# Patient Record
Sex: Female | Born: 2000 | Hispanic: No | Marital: Single | Smoking: Never smoker
Health system: Southern US, Community
[De-identification: ages and names within clinical notes are randomized; demographics above are authoritative.]

## PROBLEM LIST (undated history)

## (undated) DIAGNOSIS — G43909 Migraine, unspecified, not intractable, without status migrainosus: Secondary | ICD-10-CM

## (undated) DIAGNOSIS — I471 Supraventricular tachycardia: Secondary | ICD-10-CM

## (undated) DIAGNOSIS — J020 Streptococcal pharyngitis: Secondary | ICD-10-CM

## (undated) HISTORY — PX: WISDOM TOOTH EXTRACTION: SHX21

---

## 2019-11-16 ENCOUNTER — Ambulatory Visit: Admission: EM | Admit: 2019-11-16 | Disposition: A | Discharge status: Home / Self Care | Payer: Self-pay

## 2019-11-16 ENCOUNTER — Ambulatory Visit
Admission: EM | Admit: 2019-11-16 | Discharge: 2019-11-16 | Disposition: A | Discharge status: Home / Self Care | Payer: Managed Care, Other (non HMO)

## 2019-11-16 ENCOUNTER — Other Ambulatory Visit: Payer: Self-pay

## 2019-11-16 DIAGNOSIS — J0110 Acute frontal sinusitis, unspecified: Secondary | ICD-10-CM | POA: Diagnosis not present

## 2019-11-16 MED ORDER — FLUCONAZOLE 200 MG PO TABS: 200.0000 mg | ORAL_TABLET | Freq: Once | ORAL | 0 refills | Status: AC

## 2019-11-16 MED ORDER — AMOXICILLIN-POT CLAVULANATE 875-125 MG PO TABS: 1.0000 | ORAL_TABLET | Freq: Two times a day (BID) | ORAL | 0 refills | Status: AC

## 2019-11-16 NOTE — ED Provider Notes (Signed)
[facility] CARE CENTER   [id number] 11/16/19 Arrival Time: 1742  ZW:CHEN THROAT  SUBJECTIVE: History from: patient.  Brenda Dennis is a 19 y.o. female who presents with abrupt onset of nasal congestion, headache, fatigue for the last 10 days.  Reports that she has been using DayQuil with no relief.  Reports that she then tried Mucinex sinus max with temporary relief.  Reports history of sinus infections.  Denies sick exposure to Covid, strep, flu or mono, or precipitating event. Has negative history of Covid. Has not had Covid vaccines.  There are no aggravating factors. Denies previous symptoms in the past.     Denies fever, chills, rhinorrhea, cough, SOB, wheezing, chest pain, nausea, rash, changes in bowel or bladder habits.    ROS: As per HPI.  All other pertinent ROS negative.     History reviewed. No pertinent past medical history. History reviewed. No pertinent surgical history. No Known Allergies No current facility-administered medications on file prior to encounter.   Current Outpatient Medications on File Prior to Encounter  Medication Sig Dispense Refill  . Norethin Ace-Eth Estrad-FE (TAYTULLA) 1-20 MG-MCG(24) CAPS     . norethindrone-ethinyl estradiol-iron (JUNEL FE 1.5/30) 1.5-30 MG-MCG tablet Take 1 tablet by mouth daily.    Marland Kitchen topiramate (TOPAMAX) 50 MG tablet     . albuterol (VENTOLIN HFA) 108 (90 Base) MCG/ACT inhaler SMARTSIG:1-2 Puff(s) Via Inhaler Every 8 Hours PRN    . escitalopram (LEXAPRO) 10 MG tablet Take 15 mg by mouth daily.    Marland Kitchen gabapentin (NEURONTIN) 100 MG capsule Take 100 mg by mouth at bedtime.    . saccharomyces boulardii (FLORASTOR) 250 MG capsule Take by mouth.     Social History   Socioeconomic History  . Marital status: Single    Spouse name: Not on file  . Number of children: Not on file  . Years of education: Not on file  . Highest education level: Not on file  Occupational History  . Not on file  Tobacco Use  . Smoking status: Never  Smoker  . Smokeless tobacco: Never Used  Substance and Sexual Activity  . Alcohol use: Yes  . Drug use: Not on file  . Sexual activity: Not Currently    Birth control/protection: Pill  Other Topics Concern  . Not on file  Social History Narrative  . Not on file   Social Determinants of Health   Financial Resource Strain:   . Difficulty of Paying Living Expenses: Not on file  Food Insecurity:   . Worried About Programme researcher, broadcasting/film/video in the Last Year: Not on file  . Ran Out of Food in the Last Year: Not on file  Transportation Needs:   . Lack of Transportation (Medical): Not on file  . Lack of Transportation (Non-Medical): Not on file  Physical Activity:   . Days of Exercise per Week: Not on file  . Minutes of Exercise per Session: Not on file  Stress:   . Feeling of Stress : Not on file  Social Connections:   . Frequency of Communication with Friends and Family: Not on file  . Frequency of Social Gatherings with Friends and Family: Not on file  . Attends Religious Services: Not on file  . Active Member of Clubs or Organizations: Not on file  . Attends Banker Meetings: Not on file  . Marital Status: Not on file  Intimate Partner Violence:   . Fear of Current or Ex-Partner: Not on file  . Emotionally Abused: Not  on file  . Physically Abused: Not on file  . Sexually Abused: Not on file   Family History  Problem Relation Age of Onset  . Healthy Mother   . Healthy Father     OBJECTIVE:  Vitals:   11/16/19 1755  BP: 113/80  Pulse: 76  Resp: 16  Temp: 99.1 F (37.3 C)  TempSrc: Oral  SpO2: 98%     General appearance: alert; appears fatigued, but nontoxic, speaking in full sentences and managing own secretions HEENT: NCAT; Ears: EACs clear, TMs pearly gray with visible cone of light, without erythema; Eyes: PERRL, EOMI grossly; Nose: no obvious rhinorrhea; Throat: oropharynx clear, tonsils 1+ and mildly erythematous without white tonsillar exudates, uvula  midline; Sinuses: Left-sided sinuses tender to palpation Neck: supple with LAD Lungs: CTA bilaterally without adventitious breath sounds; cough absent Heart: regular rate and rhythm.  Radial pulses 2+ symmetrical bilaterally Skin: warm and dry Psychological: alert and cooperative; normal mood and affect  LABS: No results found for this or any previous visit (from the past 24 hour(s)).   ASSESSMENT & PLAN:  1. Acute non-recurrent frontal sinusitis     Meds ordered this encounter  Medications  . amoxicillin-clavulanate (AUGMENTIN) 875-125 MG tablet    Sig: Take 1 tablet by mouth 2 (two) times daily for 10 days.    Dispense:  20 tablet    Refill:  0    Order Specific Question:   Supervising Provider    Answer:   Merrilee Jansky [id number]  . fluconazole (DIFLUCAN) 200 MG tablet    Sig: Take 1 tablet (200 mg total) by mouth once for 1 dose.    Dispense:  2 tablet    Refill:  0    Order Specific Question:   Supervising Provider    Answer:   Merrilee Jansky [id number]    Acute Sinusitis Push fluids and get rest Prescribed amoxicillin 875mg  twice daily for 10 days.  Prescribed fluconazole in case of yeast Take as directed and to completion.  Drink warm or cool liquids, use throat lozenges, or popsicles to help alleviate symptoms Take OTC ibuprofen or tylenol as needed for pain May use Zyrtec D and flonase to help alleviate symptoms Follow up with PCP if symptoms persist Return or go to ER if you have any new or worsening symptoms such as fever, chills, nausea, vomiting, worsening sore throat, cough, abdominal pain, chest pain, changes in bowel or bladder habits.   Reviewed expectations re: course of current medical issues. Questions answered. Outlined signs and symptoms indicating need for more acute intervention. Patient verbalized understanding. After Visit Summary given.          , NP 11/16/19 1821

## 2019-11-16 NOTE — Discharge Instructions (Addendum)
You have a sinus infection  I have sent in Augmentin for you to take twice a day for 10 days  I have also sent in fluconazole in case of yeast.  Take 1 tablet on the day you start experiencing symptoms.  If you are still having symptoms 3 days later, take the second tablet.  Follow-up with this office or with primary care as needed  Follow-up with the ER for trouble swallowing, trouble breathing, high fever, other concerning symptoms.

## 2019-11-16 NOTE — ED Triage Notes (Addendum)
Pt is here with sinues that started 10 days ago, pt has taken Flonase, DayQuil, and Mucniex to relieve discomfort.

## 2019-12-09 ENCOUNTER — Ambulatory Visit
Admission: EM | Admit: 2019-12-09 | Discharge: 2019-12-09 | Disposition: A | Discharge status: Home / Self Care | Payer: Managed Care, Other (non HMO) | Attending: Family Medicine | Admitting: Family Medicine

## 2019-12-09 DIAGNOSIS — J029 Acute pharyngitis, unspecified: Secondary | ICD-10-CM | POA: Diagnosis not present

## 2019-12-09 DIAGNOSIS — B2790 Infectious mononucleosis, unspecified without complication: Secondary | ICD-10-CM | POA: Diagnosis not present

## 2019-12-09 DIAGNOSIS — R5383 Other fatigue: Secondary | ICD-10-CM

## 2019-12-09 DIAGNOSIS — B349 Viral infection, unspecified: Secondary | ICD-10-CM | POA: Diagnosis not present

## 2019-12-09 HISTORY — DX: Supraventricular tachycardia: I47.1

## 2019-12-09 HISTORY — DX: Migraine, unspecified, not intractable, without status migrainosus: G43.909

## 2019-12-09 HISTORY — DX: Streptococcal pharyngitis: J02.0

## 2019-12-09 LAB — POCT MONO SCREEN (KUC): Mono, POC: POSITIVE — AB

## 2019-12-09 LAB — POCT RAPID STREP A (OFFICE): Rapid Strep A Screen: NEGATIVE

## 2019-12-09 NOTE — Discharge Instructions (Signed)
You have mono. This is a virus.  I have included information about this attached to your paperwork  Follow up as needed with this office or with primary care as needed  Follow up with the ER for trouble swallowing, trouble breathing, other concerning symptoms.

## 2019-12-09 NOTE — ED Triage Notes (Signed)
Pt was seen here for a sinus infection a few weeks ago.  Has a little post nasal drip but feel the sinus pressure and nasal congestion are mostly resolved.  Feeling of strep throat started yesterday.  Feels like razor blades when swallowing.

## 2019-12-09 NOTE — ED Provider Notes (Signed)
[facility] CARE CENTER   [id number] 12/09/19 Arrival Time: 1731  PQ:ZRAQ THROAT  SUBJECTIVE: History from: patient.  Brenda Dennis is a 19 y.o. female who presents with abrupt onset of sore throat for the last 2 to 3 days. Reports that she was seen here in this office 2 weeks ago for sinus infection. Reports that she completed her antibiotics and that that illness is resolved. She feels that this is not related. Reports that her roommates are sick as well. Denies sick exposure to Covid, strep, flu or mono, or precipitating event. Symptoms are made worse with swallowing, but tolerating liquids and own secretions without difficulty.  Denies previous symptoms in the past.     Denies fever, chills, ear pain, sinus pain, rhinorrhea, nasal congestion, cough, SOB, wheezing, chest pain, nausea, rash, changes in bowel or bladder habits.     ROS: As per HPI.  All other pertinent ROS negative.     Past Medical History:  Diagnosis Date  . Migraines   . Strep throat   . SVT (supraventricular tachycardia) (HCC)    Past Surgical History:  Procedure Laterality Date  . WISDOM TOOTH EXTRACTION     No Known Allergies No current facility-administered medications on file prior to encounter.   Current Outpatient Medications on File Prior to Encounter  Medication Sig Dispense Refill  . gabapentin (NEURONTIN) 100 MG capsule Take 100 mg by mouth at bedtime.    . norethindrone-ethinyl estradiol-iron (JUNEL FE 1.5/30) 1.5-30 MG-MCG tablet Take 1 tablet by mouth daily.    Marland Kitchen albuterol (VENTOLIN HFA) 108 (90 Base) MCG/ACT inhaler SMARTSIG:1-2 Puff(s) Via Inhaler Every 8 Hours PRN    . escitalopram (LEXAPRO) 10 MG tablet Take 15 mg by mouth daily.    . Norethin Ace-Eth Estrad-FE (TAYTULLA) 1-20 MG-MCG(24) CAPS     . saccharomyces boulardii (FLORASTOR) 250 MG capsule Take by mouth.    . topiramate (TOPAMAX) 50 MG tablet      Social History   Socioeconomic History  . Marital status: Single    Spouse  name: Not on file  . Number of children: Not on file  . Years of education: Not on file  . Highest education level: Not on file  Occupational History  . Not on file  Tobacco Use  . Smoking status: Never Smoker  . Smokeless tobacco: Never Used  Vaping Use  . Vaping Use: Never used  Substance and Sexual Activity  . Alcohol use: Never  . Drug use: Never  . Sexual activity: Not Currently    Birth control/protection: Pill  Other Topics Concern  . Not on file  Social History Narrative  . Not on file   Social Determinants of Health   Financial Resource Strain:   . Difficulty of Paying Living Expenses: Not on file  Food Insecurity:   . Worried About Programme researcher, broadcasting/film/video in the Last Year: Not on file  . Ran Out of Food in the Last Year: Not on file  Transportation Needs:   . Lack of Transportation (Medical): Not on file  . Lack of Transportation (Non-Medical): Not on file  Physical Activity:   . Days of Exercise per Week: Not on file  . Minutes of Exercise per Session: Not on file  Stress:   . Feeling of Stress : Not on file  Social Connections:   . Frequency of Communication with Friends and Family: Not on file  . Frequency of Social Gatherings with Friends and Family: Not on file  . Attends Religious  Services: Not on file  . Active Member of Clubs or Organizations: Not on file  . Attends Banker Meetings: Not on file  . Marital Status: Not on file  Intimate Partner Violence:   . Fear of Current or Ex-Partner: Not on file  . Emotionally Abused: Not on file  . Physically Abused: Not on file  . Sexually Abused: Not on file   Family History  Problem Relation Age of Onset  . Healthy Mother   . Healthy Father     OBJECTIVE:  Vitals:   12/09/19 1753  BP: 129/87  Pulse: 94  Resp: 18  Temp: 99 F (37.2 C)  TempSrc: Oral  SpO2: 98%     General appearance: alert; appears fatigued, but nontoxic, speaking in full sentences and managing own  secretions HEENT: NCAT; Ears: EACs clear, TMs pearly gray with visible cone of light, without erythema; Eyes: PERRL, EOMI grossly; Nose: no obvious rhinorrhea; Throat: oropharynx erythematous, tonsils 1+ and erythematous with white tonsillar exudates, uvula midline Neck: supple with LAD Lungs: CTA bilaterally without adventitious breath sounds; cough absent Heart: regular rate and rhythm.  Radial pulses 2+ symmetrical bilaterally Skin: warm and dry Psychological: alert and cooperative; normal mood and affect  LABS: Results for orders placed or performed during the hospital encounter of 12/09/19 (from the past 24 hour(s))  POCT rapid strep A     Status: None   Collection Time: 12/09/19  5:48 PM  Result Value Ref Range   Rapid Strep A Screen Negative Negative  POCT mono screen     Status: Abnormal   Collection Time: 12/09/19  6:00 PM  Result Value Ref Range   Mono, POC Positive (A) Negative     ASSESSMENT & PLAN:  1. Infectious mononucleosis without complication, infectious mononucleosis due to unspecified organism   2. Sore throat   3. Other fatigue   4. Viral illness    Strep test negative, will send out for culture and we will call you with results Mono test is positive Get plenty of rest and push fluids Take OTC Zyrtec and use chloraseptic spray as needed for throat pain. Drink warm or cool liquids, use throat lozenges, or popsicles to help alleviate symptoms Take OTC ibuprofen or tylenol as needed for pain Follow up with PCP if symptoms persists Return or go to ER if patient has any new or worsening symptoms such as fever, chills, nausea, vomiting, worsening sore throat, cough, abdominal pain, chest pain, changes in bowel or bladder habits  Reviewed expectations re: course of current medical issues. Questions answered. Outlined signs and symptoms indicating need for more acute intervention. Patient verbalized understanding. After Visit Summary given.           Moshe Cipro, NP 12/10/19 1253

## 2020-01-03 ENCOUNTER — Ambulatory Visit
Admission: EM | Admit: 2020-01-03 | Discharge: 2020-01-03 | Disposition: A | Discharge status: Home / Self Care | Payer: Managed Care, Other (non HMO) | Attending: Emergency Medicine | Admitting: Emergency Medicine

## 2020-01-03 ENCOUNTER — Ambulatory Visit (INDEPENDENT_AMBULATORY_CARE_PROVIDER_SITE_OTHER): Payer: Managed Care, Other (non HMO)

## 2020-01-03 DIAGNOSIS — R0981 Nasal congestion: Secondary | ICD-10-CM

## 2020-01-03 DIAGNOSIS — R059 Cough, unspecified: Secondary | ICD-10-CM

## 2020-01-03 DIAGNOSIS — J069 Acute upper respiratory infection, unspecified: Secondary | ICD-10-CM

## 2020-01-03 MED ORDER — BENZONATATE 100 MG PO CAPS: 100.0000 mg | ORAL_CAPSULE | Freq: Three times a day (TID) | ORAL | 0 refills | Status: AC

## 2020-01-03 NOTE — ED Triage Notes (Addendum)
Pt presents with complaints of continued cough. Reports she was here and diagnosed with Mono a month ago. Reports concern for the chest congestion and cough turning productive over the last week. Reports difficulty sleeping at night. A deep breath in triggers her cough. Pt covid test was negative. Reports taking otc medications with no relief.

## 2020-01-03 NOTE — ED Provider Notes (Signed)
Renaldo Fiddler    CSN: [id number] Arrival date & time: 01/03/20  2751      History   Chief Complaint Chief Complaint  Patient presents with  . Cough    HPI Brenda Dennis is a 19 y.o. female.   Presents with 1 month history of cough productive of yellow phlegm.  She also reports sinus congestion, runny nose, postnasal drip.  She denies fever, chills, shortness of breath, vomiting, diarrhea, or other symptoms.  Patient was seen here by NP Ashley Royalty on 12/09/2019; diagnosed with mono, sore throat, fatigue, viral illness; treated symptomatically.  She was also seen here by NP Ashley Royalty on 11/16/2019; diagnosed with acute sinusitis; treated with Augmentin and Diflucan.  Her medical history includes SVT, migraine headaches, strep throat.  The history is provided by the patient and medical records.    Past Medical History:  Diagnosis Date  . Migraines   . Strep throat   . SVT (supraventricular tachycardia) (HCC)     There are no problems to display for this patient.   Past Surgical History:  Procedure Laterality Date  . WISDOM TOOTH EXTRACTION      OB History   No obstetric history on file.      Home Medications    Prior to Admission medications   Medication Sig Start Date End Date Taking? Authorizing Provider  albuterol (VENTOLIN HFA) 108 (90 Base) MCG/ACT inhaler SMARTSIG:1-2 Puff(s) Via Inhaler Every 8 Hours PRN 07/04/19   [provider]  benzonatate (TESSALON) 100 MG capsule Take 1 capsule (100 mg total) by mouth every 8 (eight) hours. 01/03/20   Mickie Bail, NP  escitalopram (LEXAPRO) 10 MG tablet Take 15 mg by mouth daily. 09/03/19   [provider]  gabapentin (NEURONTIN) 100 MG capsule Take 100 mg by mouth at bedtime. 10/31/19   [provider]  Norethin Ace-Eth Estrad-FE (TAYTULLA) 1-20 MG-MCG(24) CAPS  08/02/16   [provider]  norethindrone-ethinyl estradiol-iron (JUNEL FE 1.5/30) 1.5-30 MG-MCG tablet Take 1 tablet by  mouth daily. 05/08/18   [provider]  saccharomyces boulardii (FLORASTOR) 250 MG capsule Take by mouth.    [provider]  topiramate (TOPAMAX) 50 MG tablet  05/04/14   [provider]    Family History Family History  Problem Relation Age of Onset  . Healthy Mother   . Healthy Father     Social History Social History   Tobacco Use  . Smoking status: Never Smoker  . Smokeless tobacco: Never Used  Vaping Use  . Vaping Use: Never used  Substance Use Topics  . Alcohol use: Never  . Drug use: Never     Allergies   Patient has no known allergies.   Review of Systems Review of Systems  Constitutional: Negative for chills and fever.  HENT: Positive for congestion, postnasal drip and rhinorrhea. Negative for ear pain and sore throat.   Eyes: Negative for pain and visual disturbance.  Respiratory: Positive for cough. Negative for shortness of breath.   Cardiovascular: Negative for chest pain and palpitations.  Gastrointestinal: Negative for abdominal pain, diarrhea and vomiting.  Genitourinary: Negative for dysuria and hematuria.  Musculoskeletal: Negative for arthralgias and back pain.  Skin: Negative for color change and rash.  Neurological: Negative for seizures and syncope.  All other systems reviewed and are negative.    Physical Exam Triage Vital Signs ED Triage Vitals  Enc Vitals Group     BP      Pulse  Resp      Temp      Temp src      SpO2      Weight      Height      Head Circumference      Peak Flow      Pain Score      Pain Loc      Pain Edu?      Excl. in GC?    No data found.  Updated Vital Signs BP 113/79   Pulse 76   Temp 98.8 F (37.1 C)   Resp 19   LMP 12/27/2019   SpO2 97%   Visual Acuity Right Eye Distance:   Left Eye Distance:   Bilateral Distance:    Right Eye Near:   Left Eye Near:    Bilateral Near:     Physical Exam Vitals and nursing note reviewed.  Constitutional:      General:  She is not in acute distress.    Appearance: She is well-developed. She is not ill-appearing.  HENT:     Head: Normocephalic and atraumatic.     Right Ear: Tympanic membrane normal.     Left Ear: Tympanic membrane normal.     Nose: Rhinorrhea present.     Mouth/Throat:     Mouth: Mucous membranes are moist.     Pharynx: Oropharynx is clear.  Eyes:     Conjunctiva/sclera: Conjunctivae normal.  Cardiovascular:     Rate and Rhythm: Normal rate and regular rhythm.     Heart sounds: No murmur heard.   Pulmonary:     Effort: Pulmonary effort is normal. No respiratory distress.     Breath sounds: Normal breath sounds. No wheezing or rhonchi.  Abdominal:     Palpations: Abdomen is soft.     Tenderness: There is no abdominal tenderness. There is no guarding or rebound.  Musculoskeletal:     Cervical back: Neck supple.  Skin:    General: Skin is warm and dry.     Findings: No rash.  Neurological:     General: No focal deficit present.     Mental Status: She is alert and oriented to person, place, and time.     Gait: Gait normal.  Psychiatric:        Mood and Affect: Mood normal.        Behavior: Behavior normal.      UC Treatments / Results  Labs (all labs ordered are listed, but only abnormal results are displayed) Labs Reviewed - No data to display  EKG   Radiology DG Chest 2 View  Result Date: 01/03/2020 CLINICAL DATA:  Productive cough. EXAM: CHEST - 2 VIEW COMPARISON:  None. FINDINGS: The heart size and mediastinal contours are within normal limits. Both lungs are clear. The visualized skeletal structures are unremarkable. IMPRESSION: No active cardiopulmonary disease. Electronically Signed   By: Kennith Center M.D.   On: 01/03/2020 09:48    Procedures Procedures (including critical care time)  Medications Ordered in UC Medications - No data to display  Initial Impression / Assessment and Plan / UC Course  I have reviewed the triage vital signs and the nursing  notes.  Pertinent labs & imaging results that were available during my care of the patient were reviewed by me and considered in my medical decision making (see chart for details).   Cough, sinus congestion, viral URI. Chest x-ray negative. Treating with Tessalon Perles and Mucinex. Instructed patient to follow-up with her PCP if  she is not improving. Patient agrees to plan of care.   Final Clinical Impressions(s) / UC Diagnoses   Final diagnoses:  Cough  Sinus congestion  Viral URI with cough     Discharge Instructions     Your chest xray is normal.    Take the Tessalon Perles as directed.    Follow up with your primary care provider if your symptoms are not improving.        ED Prescriptions    Medication Sig Dispense Auth. Provider   benzonatate (TESSALON) 100 MG capsule Take 1 capsule (100 mg total) by mouth every 8 (eight) hours. 21 capsule Mickie Bail, NP     PDMP not reviewed this encounter.   Mickie Bail, NP 01/03/20 [phone]

## 2020-01-03 NOTE — Discharge Instructions (Addendum)
Your chest xray is normal.    Take the Tessalon Perles as directed.    Follow up with your primary care provider if your symptoms are not improving.

## 2021-02-01 IMAGING — DX DG CHEST 2V
2 series · 2 of 2 positions shown · non-contrast
Comparison: None.

CLINICAL DATA: Productive cough.

EXAM:
CHEST - 2 VIEW

[chest pa]
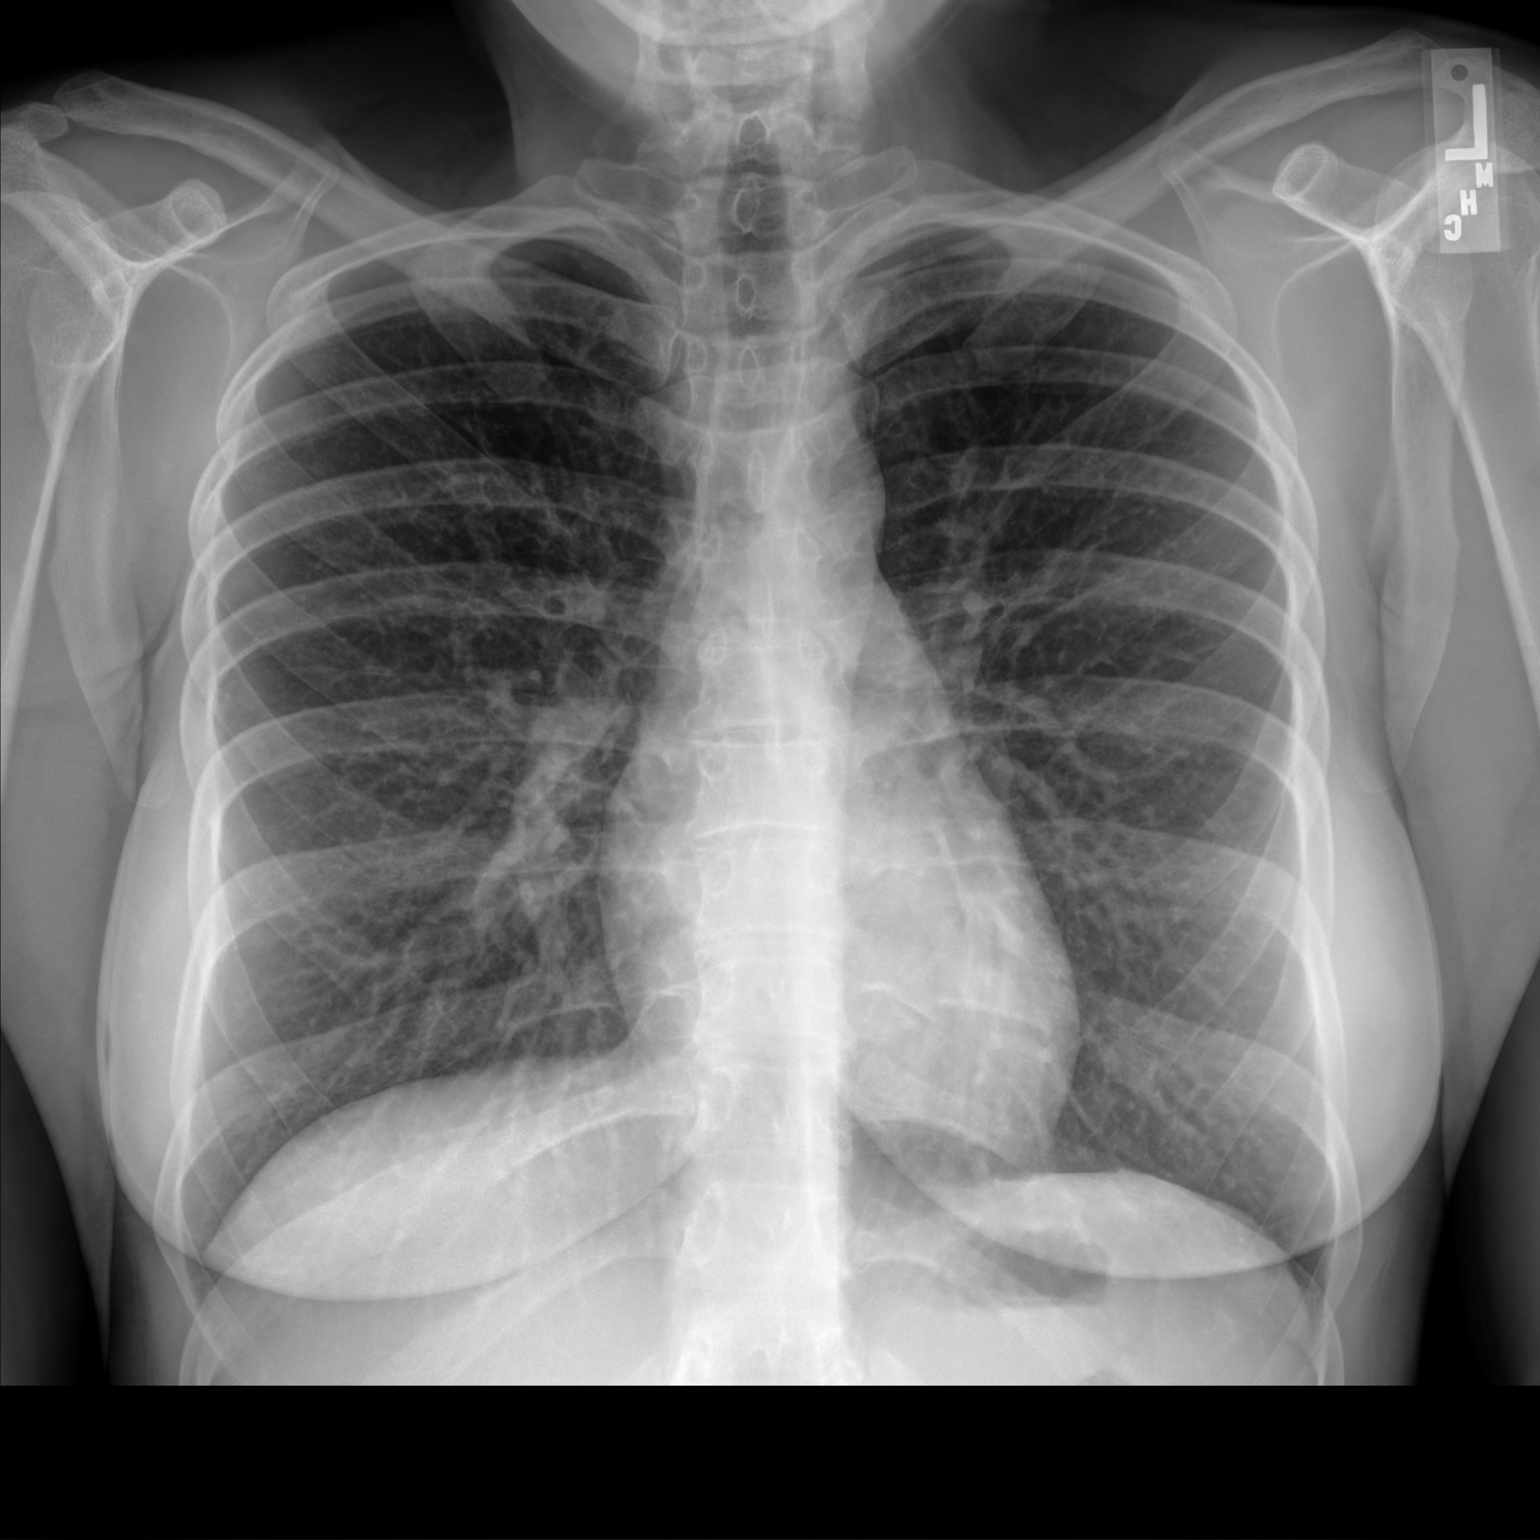

[chest lat]
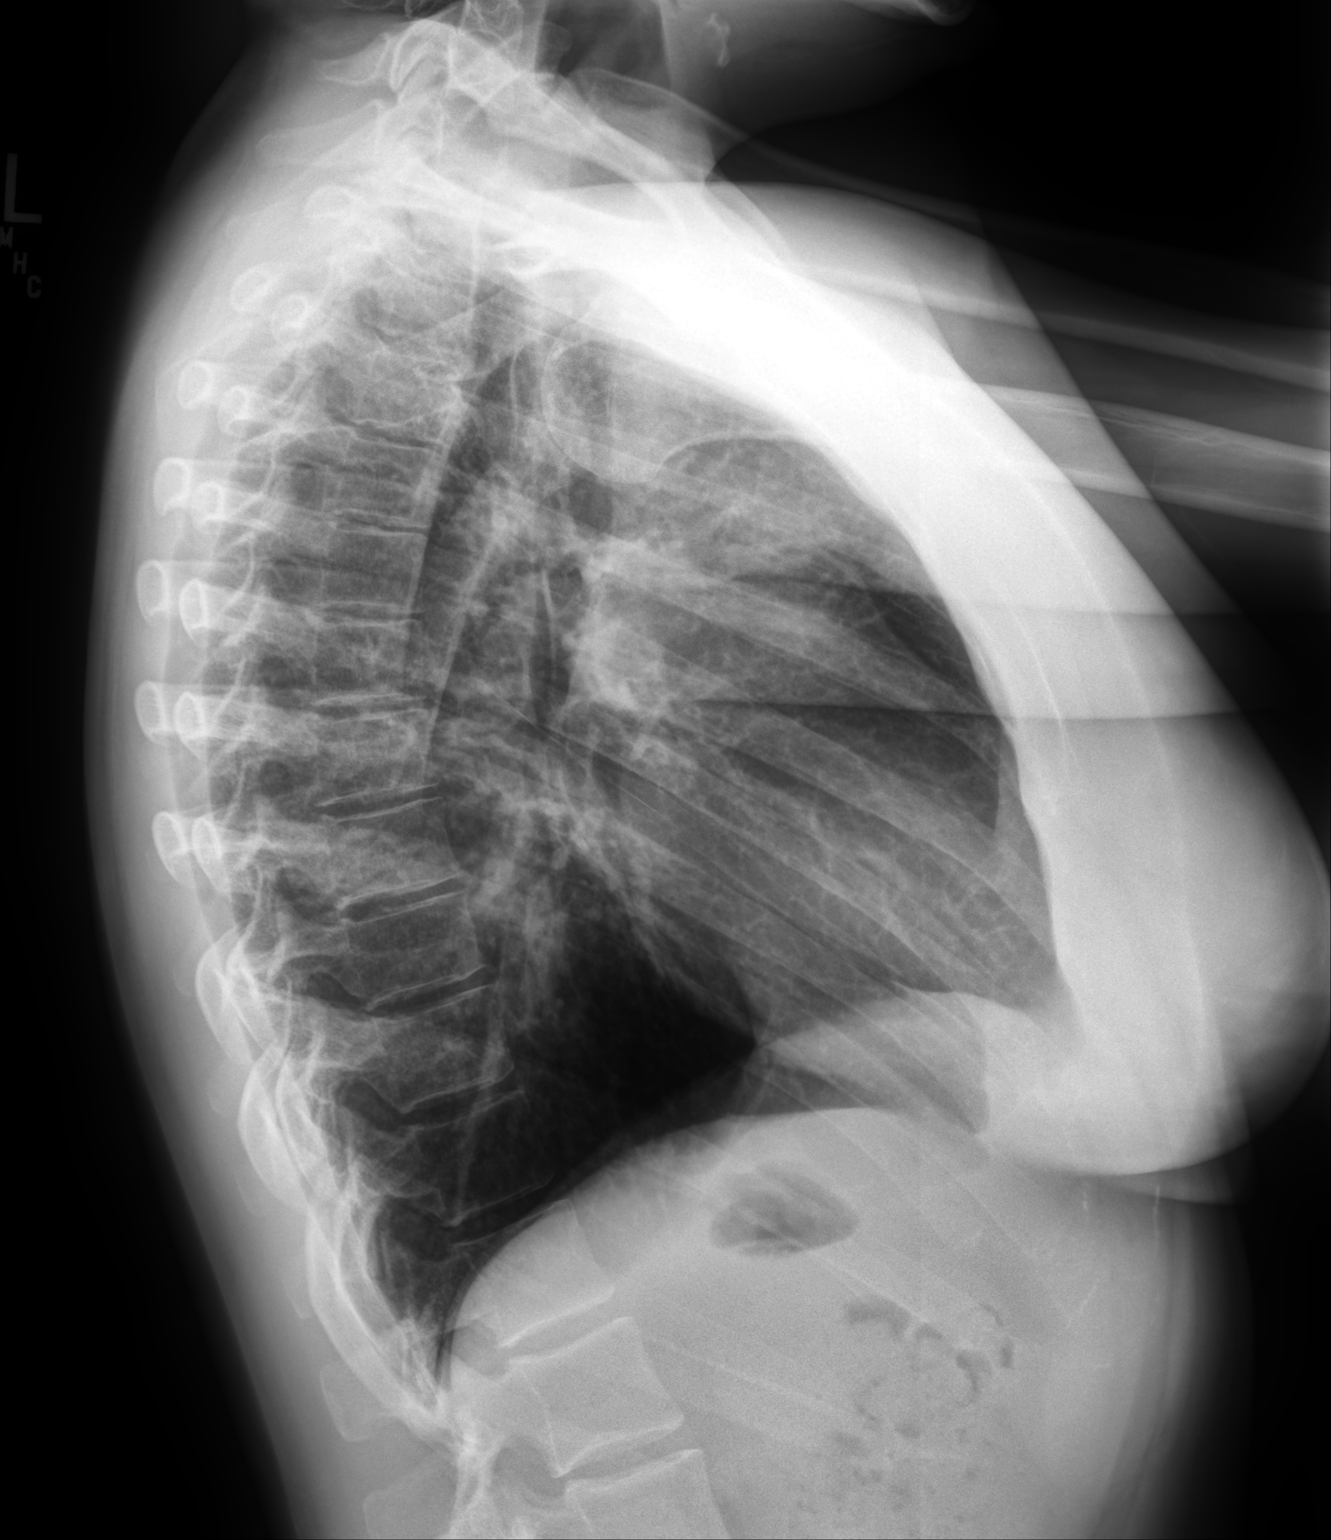

[2 of 2 positions shown; findings below may reference images not displayed]

FINDINGS: The heart size and mediastinal contours are within normal limits.
Both lungs are clear. The visualized skeletal structures are
unremarkable.
IMPRESSION: No active cardiopulmonary disease.
